# Patient Record
Sex: Female | Born: 1995 | Race: Black or African American | Hispanic: No | Marital: Single | Smoking: Current some day smoker
Health system: Southern US, Community
[De-identification: ages and names within clinical notes are randomized; demographics above are authoritative.]

---
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
## 2019-08-16 ENCOUNTER — Other Ambulatory Visit: Payer: Self-pay

## 2019-08-16 ENCOUNTER — Encounter (HOSPITAL_COMMUNITY): Payer: Self-pay

## 2019-08-16 ENCOUNTER — Emergency Department (HOSPITAL_COMMUNITY)
Admission: EM | Admit: 2019-08-16 | Discharge: 2019-08-16 | Disposition: A | Discharge status: Home / Self Care | Payer: Managed Care, Other (non HMO) | Attending: Emergency Medicine | Admitting: Emergency Medicine

## 2019-08-16 DIAGNOSIS — Z79899 Other long term (current) drug therapy: Secondary | ICD-10-CM | POA: Diagnosis not present

## 2019-08-16 DIAGNOSIS — R112 Nausea with vomiting, unspecified: Secondary | ICD-10-CM | POA: Diagnosis present

## 2019-08-16 LAB — HCG, QUANTITATIVE, PREGNANCY: hCG, Beta Chain, Quant, S: <1 m[IU]/mL (ref <5)

## 2019-08-16 LAB — URINALYSIS, ROUTINE W REFLEX MICROSCOPIC
Bacteria, UA: NONE SEEN
Bilirubin Urine: NEGATIVE
Glucose, UA: NEGATIVE mg/dL
Hgb urine dipstick: NEGATIVE
Ketones, ur: NEGATIVE mg/dL
Nitrite: NEGATIVE
Protein, ur: NEGATIVE mg/dL
Specific Gravity, Urine: 1.026 (ref 1.005–1.030)
pH: 7 (ref 5.0–8.0)

## 2019-08-16 LAB — COMPREHENSIVE METABOLIC PANEL
ALT: 33 U/L (ref 0–44)
AST: 25 U/L (ref 15–41)
Albumin: 4.3 g/dL (ref 3.5–5.0)
Alkaline Phosphatase: 69 U/L (ref 38–126)
Anion gap: 10 (ref 5–15)
BUN: 14 mg/dL (ref 6–20)
CO2: 22 mmol/L (ref 22–32)
Calcium: 9.6 mg/dL (ref 8.9–10.3)
Chloride: 108 mmol/L (ref 98–111)
Creatinine, Ser: 0.68 mg/dL (ref 0.44–1.00)
GFR calc Af Amer: >60 mL/min (ref >60)
GFR calc non Af Amer: >60 mL/min (ref >60)
Glucose, Bld: 106 mg/dL — ABNORMAL HIGH (ref 70–99)
Potassium: 3.9 mmol/L (ref 3.5–5.1)
Sodium: 140 mmol/L (ref 135–145)
Total Bilirubin: 1.1 mg/dL (ref 0.3–1.2)
Total Protein: 8.4 g/dL — ABNORMAL HIGH (ref 6.5–8.1)

## 2019-08-16 LAB — CBC
HCT: 41 % (ref 36.0–46.0)
Hemoglobin: 13 g/dL (ref 12.0–15.0)
MCH: 27.4 pg (ref 26.0–34.0)
MCHC: 31.7 g/dL (ref 30.0–36.0)
MCV: 86.5 fL (ref 80.0–100.0)
Platelets: 228 10*3/uL (ref 150–400)
RBC: 4.74 MIL/uL (ref 3.87–5.11)
RDW: 13.6 % (ref 11.5–15.5)
WBC: 8 10*3/uL (ref 4.0–10.5)
nRBC: 0 % (ref 0.0–0.2)

## 2019-08-16 LAB — LIPASE, BLOOD: Lipase: 24 U/L (ref 11–51)

## 2019-08-16 MED ORDER — ONDANSETRON HCL 4 MG/2ML IJ SOLN
4.0000 mg | Freq: Once | INTRAMUSCULAR | Status: AC
  Administered 2019-08-16: 4 mg via INTRAVENOUS
  Filled 2019-08-16: qty 2

## 2019-08-16 MED ORDER — ONDANSETRON 8 MG PO TBDP: 8.0000 mg | ORAL_TABLET | Freq: Three times a day (TID) | ORAL | 0 refills | Status: DC | PRN

## 2019-08-16 MED ORDER — SODIUM CHLORIDE 0.9 % IV BOLUS (SEPSIS)
1000.0000 mL | Freq: Once | INTRAVENOUS | Status: AC
  Administered 2019-08-16: 1000 mL via INTRAVENOUS

## 2019-08-16 MED ORDER — SODIUM CHLORIDE 0.9% FLUSH: 3.0000 mL | Freq: Once | INTRAVENOUS | Status: DC

## 2019-08-16 MED ORDER — SODIUM CHLORIDE 0.9 % IV SOLN: 1000.0000 mL | INTRAVENOUS | Status: DC

## 2019-08-16 NOTE — ED Provider Notes (Signed)
[facility] DEPT Provider Note   CSN: [id number] Arrival date & time: 08/16/19  1030     History   Chief Complaint Chief Complaint  Patient presents with  . Emesis    HPI Cassandra Spencer is a 23 y.o. female.     HPI Patient presents to the emergency room with complaints of nausea and vomiting.  Patient admits to excessive alcohol consumption last evening.  Since she woke up this morning she has had persistent nausea and vomiting.  Patient is unable to keep down any food or fluids.  She last vomited 30 minutes ago.  She denies any diarrhea.  No abdominal pain.  No fevers.  Patient denies having any previous episodes similar to this. History reviewed. No pertinent past medical history.  There are no active problems to display for this patient.   History reviewed. No pertinent surgical history.   OB History   No obstetric history on file.      Home Medications    Prior to Admission medications   Medication Sig Start Date End Date Taking? Authorizing Provider  hydrOXYzine (ATARAX/VISTARIL) 25 MG tablet Take 1 tablet (25 mg total) by mouth every 6 (six) hours. Prn hives 12/10/16   Billy Fischer, MD  ipratropium (ATROVENT) 0.06 % nasal spray Place 2 sprays into both nostrils 4 (four) times daily. 11/30/15   Billy Fischer, MD  ondansetron (ZOFRAN ODT) 8 MG disintegrating tablet Take 1 tablet (8 mg total) by mouth every 8 (eight) hours as needed for nausea or vomiting. 08/16/19   Dorie Rank, MD    Family History History reviewed. No pertinent family history.  Social History Social History   Tobacco Use  . Smoking status: Never Smoker  . Smokeless tobacco: Never Used  Substance Use Topics  . Alcohol use: No  . Drug use: No     Allergies   Patient has no known allergies.   Review of Systems Review of Systems  All other systems reviewed and are negative.    Physical Exam Updated Vital Signs BP 125/76 (BP Location: Left Arm)    Pulse 90   Temp 98.3 F (36.8 C) (Oral)   Resp 17   SpO2 100%   Physical Exam Vitals signs and nursing note reviewed.  Constitutional:      General: She is not in acute distress.    Appearance: She is well-developed.  HENT:     Head: Normocephalic and atraumatic.     Right Ear: External ear normal.     Left Ear: External ear normal.  Eyes:     General: No scleral icterus.       Right eye: No discharge.        Left eye: No discharge.     Conjunctiva/sclera: Conjunctivae normal.  Neck:     Musculoskeletal: Neck supple.     Trachea: No tracheal deviation.  Cardiovascular:     Rate and Rhythm: Normal rate and regular rhythm.  Pulmonary:     Effort: Pulmonary effort is normal. No respiratory distress.     Breath sounds: Normal breath sounds. No stridor. No wheezing or rales.  Abdominal:     General: Bowel sounds are normal. There is no distension.     Palpations: Abdomen is soft.     Tenderness: There is no abdominal tenderness. There is no guarding or rebound.  Musculoskeletal:        General: No tenderness.  Skin:    General: Skin is warm and dry.  Findings: No rash.  Neurological:     Mental Status: She is alert.     Cranial Nerves: No cranial nerve deficit (no facial droop, extraocular movements intact, no slurred speech).     Sensory: No sensory deficit.     Motor: No abnormal muscle tone or seizure activity.     Coordination: Coordination normal.      ED Treatments / Results  Labs (all labs ordered are listed, but only abnormal results are displayed) Labs Reviewed  COMPREHENSIVE METABOLIC PANEL - Abnormal; Notable for the following components:      Result Value   Glucose, Bld 106 (*)    Total Protein 8.4 (*)    All other components within normal limits  URINALYSIS, ROUTINE W REFLEX MICROSCOPIC - Abnormal; Notable for the following components:   Leukocytes,Ua TRACE (*)    All other components within normal limits  LIPASE, BLOOD  CBC  HCG, QUANTITATIVE,  PREGNANCY    EKG None  Radiology No results found.  Procedures Procedures (including critical care time)  Medications Ordered in ED Medications  sodium chloride flush (NS) 0.9 % injection 3 mL (has no administration in time range)  sodium chloride 0.9 % bolus 1,000 mL (1,000 mLs Intravenous New Bag/Given 08/16/19 1523)    Followed by  sodium chloride 0.9 % bolus 1,000 mL (1,000 mLs Intravenous New Bag/Given 08/16/19 1535)    Followed by  0.9 %  sodium chloride infusion (has no administration in time range)  ondansetron (ZOFRAN) injection 4 mg (4 mg Intravenous Given 08/16/19 1524)     Initial Impression / Assessment and Plan / ED Course  I have reviewed the triage vital signs and the nursing notes.  Pertinent labs & imaging results that were available during my care of the patient were reviewed by me and considered in my medical decision making (see chart for details).  Clinical Course as of Aug 16 1639  Sun Aug 16, 2019  1506 Labs without signs of hepatitis or pancreatitis.  Most likely alcoholic gastritis.  We will proceed with IV hydration and antiemetics.   [JK]  1639 Patient is feeling better after treatment.   [JK]    Clinical Course User Index [JK] Linwood Dibbles, MD     Symptoms consistent with alcohol induced gastritis.  No signs of hepatitis or pancreatitis.  Patient improved with IV hydration and antiemetics.  Final Clinical Impressions(s) / ED Diagnoses   Final diagnoses:  Non-intractable vomiting with nausea, unspecified vomiting type    ED Discharge Orders         Ordered    ondansetron (ZOFRAN ODT) 8 MG disintegrating tablet  Every 8 hours PRN,   Status:  Discontinued     08/16/19 1637    ondansetron (ZOFRAN ODT) 8 MG disintegrating tablet  Every 8 hours PRN     08/16/19 1638           Linwood Dibbles, MD 08/16/19 1640

## 2019-08-16 NOTE — ED Notes (Signed)
Pt states nausea has subsided and she "is feeling a lot better now".
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
## 2019-08-16 NOTE — ED Triage Notes (Signed)
Pt states she drank ETOH last night and has had 5 episodes of emesis today. Unable to keep food/water down.

## 2019-08-16 NOTE — Discharge Instructions (Addendum)
Take the medications as needed for nausea and vomiting.  I sent the prescription to your pharmacy.  I also gave you a paper prescription in case your pharmacy is closed.  You do not need to fill both, they are the same prescription.

## 2019-11-02 ENCOUNTER — Ambulatory Visit (HOSPITAL_COMMUNITY)
Admission: EM | Admit: 2019-11-02 | Discharge: 2019-11-02 | Disposition: A | Discharge status: Home / Self Care | Payer: Self-pay | Attending: Family Medicine | Admitting: Family Medicine

## 2019-11-02 ENCOUNTER — Ambulatory Visit (INDEPENDENT_AMBULATORY_CARE_PROVIDER_SITE_OTHER): Payer: Self-pay

## 2019-11-02 ENCOUNTER — Other Ambulatory Visit: Payer: Self-pay

## 2019-11-02 ENCOUNTER — Encounter (HOSPITAL_COMMUNITY): Payer: Self-pay

## 2019-11-02 DIAGNOSIS — S93402A Sprain of unspecified ligament of left ankle, initial encounter: Secondary | ICD-10-CM

## 2019-11-02 MED ORDER — IBUPROFEN 800 MG PO TABS: 800.0000 mg | ORAL_TABLET | Freq: Three times a day (TID) | ORAL | 0 refills | Status: DC | PRN
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
## 2021-10-29 ENCOUNTER — Encounter (HOSPITAL_BASED_OUTPATIENT_CLINIC_OR_DEPARTMENT_OTHER): Payer: Self-pay | Admitting: Obstetrics and Gynecology

## 2021-10-29 ENCOUNTER — Emergency Department (HOSPITAL_BASED_OUTPATIENT_CLINIC_OR_DEPARTMENT_OTHER)
Admission: EM | Admit: 2021-10-29 | Discharge: 2021-10-29 | Disposition: A | Discharge status: Home / Self Care | Payer: Managed Care, Other (non HMO) | Attending: Emergency Medicine | Admitting: Emergency Medicine

## 2021-10-29 ENCOUNTER — Other Ambulatory Visit: Payer: Self-pay

## 2021-10-29 DIAGNOSIS — F1010 Alcohol abuse, uncomplicated: Secondary | ICD-10-CM

## 2021-10-29 DIAGNOSIS — F1090 Alcohol use, unspecified, uncomplicated: Secondary | ICD-10-CM | POA: Insufficient documentation

## 2021-10-29 DIAGNOSIS — F5081 Binge eating disorder: Secondary | ICD-10-CM | POA: Insufficient documentation

## 2021-10-29 DIAGNOSIS — N390 Urinary tract infection, site not specified: Secondary | ICD-10-CM | POA: Insufficient documentation

## 2021-10-29 LAB — URINALYSIS, ROUTINE W REFLEX MICROSCOPIC
Bilirubin Urine: NEGATIVE
Glucose, UA: NEGATIVE mg/dL
Ketones, ur: NEGATIVE mg/dL
Nitrite: NEGATIVE
Protein, ur: 30 mg/dL — AB
Specific Gravity, Urine: 1.032 — ABNORMAL HIGH (ref 1.005–1.030)
pH: 6.5 (ref 5.0–8.0)

## 2021-10-29 LAB — COMPREHENSIVE METABOLIC PANEL
ALT: 16 U/L (ref 0–44)
AST: 16 U/L (ref 15–41)
Albumin: 4.4 g/dL (ref 3.5–5.0)
Alkaline Phosphatase: 58 U/L (ref 38–126)
Anion gap: 9 (ref 5–15)
BUN: 14 mg/dL (ref 6–20)
CO2: 24 mmol/L (ref 22–32)
Calcium: 9.5 mg/dL (ref 8.9–10.3)
Chloride: 106 mmol/L (ref 98–111)
Creatinine, Ser: 0.74 mg/dL (ref 0.44–1.00)
GFR, Estimated: >60 mL/min (ref >60)
Glucose, Bld: 114 mg/dL — ABNORMAL HIGH (ref 70–99)
Potassium: 4 mmol/L (ref 3.5–5.1)
Sodium: 139 mmol/L (ref 135–145)
Total Bilirubin: 0.9 mg/dL (ref 0.3–1.2)
Total Protein: 8.1 g/dL (ref 6.5–8.1)

## 2021-10-29 LAB — LIPASE, BLOOD: Lipase: <10 U/L — ABNORMAL LOW (ref 11–51)

## 2021-10-29 LAB — CBC
HCT: 40.7 % (ref 36.0–46.0)
Hemoglobin: 13.2 g/dL (ref 12.0–15.0)
MCH: 28 pg (ref 26.0–34.0)
MCHC: 32.4 g/dL (ref 30.0–36.0)
MCV: 86.2 fL (ref 80.0–100.0)
Platelets: 237 10*3/uL (ref 150–400)
RBC: 4.72 MIL/uL (ref 3.87–5.11)
RDW: 13.3 % (ref 11.5–15.5)
WBC: 6.6 10*3/uL (ref 4.0–10.5)
nRBC: 0 % (ref 0.0–0.2)

## 2021-10-29 LAB — PREGNANCY, URINE: Preg Test, Ur: NEGATIVE

## 2021-10-29 MED ORDER — ONDANSETRON HCL 4 MG/2ML IJ SOLN
4.0000 mg | Freq: Once | INTRAMUSCULAR | Status: AC
  Administered 2021-10-29: 4 mg via INTRAVENOUS
  Filled 2021-10-29: qty 2

## 2021-10-29 MED ORDER — CEPHALEXIN 500 MG PO CAPS: 500.0000 mg | ORAL_CAPSULE | Freq: Four times a day (QID) | ORAL | 0 refills | Status: AC

## 2021-10-29 MED ORDER — SODIUM CHLORIDE 0.9 % IV BOLUS
1000.0000 mL | Freq: Once | INTRAVENOUS | Status: AC
  Administered 2021-10-29: 1000 mL via INTRAVENOUS

## 2021-10-29 MED ORDER — ONDANSETRON HCL 4 MG PO TABS: 4.0000 mg | ORAL_TABLET | Freq: Three times a day (TID) | ORAL | 0 refills | Status: DC | PRN

## 2021-10-29 NOTE — ED Provider Notes (Signed)
[facility] EMERGENCY DEPT Provider Note   CSN: [id number] Arrival date & time: 10/29/21  1221     History  Chief Complaint  Patient presents with   Emesis    Cassandra Spencer is a 26 y.o. female.  Presents ER with concern for vomiting.  Patient reports that she drank heavily last night, mostly liquor.  Believes that she drank too much.  When she woke up she felt very nauseous, vomiting, multiple episodes of vomiting, no blood.  No abdominal pain.  No fevers or chills.  No dysuria or hematuria for flank pain.  Reviewed chart, no recent PCP visits, last ER visit in 2021 for ankle sprain.  HPI     Home Medications Prior to Admission medications   Medication Sig Start Date End Date Taking? Authorizing Provider  cephALEXin (KEFLEX) 500 MG capsule Take 1 capsule (500 mg total) by mouth 4 (four) times daily for 5 days. 10/29/21 11/03/21 Yes Xylan Sheils, Ellwood Dense, MD  ondansetron (ZOFRAN) 4 MG tablet Take 1 tablet (4 mg total) by mouth every 8 (eight) hours as needed for nausea or vomiting. 10/29/21  Yes Lucrezia Starch, MD  hydrOXYzine (ATARAX/VISTARIL) 25 MG tablet Take 1 tablet (25 mg total) by mouth every 6 (six) hours. Prn hives 12/10/16   Billy Fischer, MD  ibuprofen (ADVIL) 800 MG tablet Take 1 tablet (800 mg total) by mouth every 8 (eight) hours as needed (PAIN). 11/02/19   Enrique Sack, FNP  ipratropium (ATROVENT) 0.06 % nasal spray Place 2 sprays into both nostrils 4 (four) times daily. 11/30/15   Billy Fischer, MD  ondansetron (ZOFRAN ODT) 8 MG disintegrating tablet Take 1 tablet (8 mg total) by mouth every 8 (eight) hours as needed for nausea or vomiting. 08/16/19   Dorie Rank, MD      Allergies    Patient has no known allergies.    Review of Systems   Review of Systems  Constitutional:  Negative for chills and fever.  HENT:  Negative for ear pain and sore throat.   Eyes:  Negative for pain and visual disturbance.  Respiratory:  Negative for cough and  shortness of breath.   Cardiovascular:  Negative for chest pain and palpitations.  Gastrointestinal:  Positive for nausea and vomiting. Negative for abdominal pain.  Genitourinary:  Negative for dysuria and hematuria.  Musculoskeletal:  Negative for arthralgias and back pain.  Skin:  Negative for color change and rash.  Neurological:  Negative for seizures and syncope.  All other systems reviewed and are negative.  Physical Exam Updated Vital Signs BP 111/78    Pulse 98    Temp 98.4 F (36.9 C)    Resp 18    Ht 5\' 7"  (1.702 m)    Wt 104.3 kg    LMP 10/22/2021 (Approximate)    SpO2 100%    BMI 36.02 kg/m  Physical Exam Vitals and nursing note reviewed.  Constitutional:      General: She is not in acute distress.    Appearance: She is well-developed.  HENT:     Head: Normocephalic and atraumatic.  Eyes:     Conjunctiva/sclera: Conjunctivae normal.  Cardiovascular:     Rate and Rhythm: Normal rate and regular rhythm.     Heart sounds: No murmur heard. Pulmonary:     Effort: Pulmonary effort is normal. No respiratory distress.     Breath sounds: Normal breath sounds.  Abdominal:     Palpations: Abdomen is soft.     Tenderness:  There is no abdominal tenderness.  Musculoskeletal:        General: No swelling.     Cervical back: Neck supple.  Skin:    General: Skin is warm and dry.     Capillary Refill: Capillary refill takes less than 2 seconds.  Neurological:     General: No focal deficit present.     Mental Status: She is alert.  Psychiatric:        Mood and Affect: Mood normal.    ED Results / Procedures / Treatments   Labs (all labs ordered are listed, but only abnormal results are displayed) Labs Reviewed  LIPASE, BLOOD - Abnormal; Notable for the following components:      Result Value   Lipase <10 (*)    All other components within normal limits  COMPREHENSIVE METABOLIC PANEL - Abnormal; Notable for the following components:   Glucose, Bld 114 (*)    All other  components within normal limits  URINALYSIS, ROUTINE W REFLEX MICROSCOPIC - Abnormal; Notable for the following components:   APPearance HAZY (*)    Specific Gravity, Urine 1.032 (*)    Hgb urine dipstick SMALL (*)    Protein, ur 30 (*)    Leukocytes,Ua MODERATE (*)    Bacteria, UA MANY (*)    All other components within normal limits  CBC  PREGNANCY, URINE    EKG None  Radiology No results found.  Procedures Procedures    Medications Ordered in ED Medications  sodium chloride 0.9 % bolus 1,000 mL (0 mLs Intravenous Stopped 10/29/21 1519)  ondansetron (ZOFRAN) injection 4 mg (4 mg Intravenous Given 10/29/21 1337)    ED Course/ Medical Decision Making/ A&P                           Medical Decision Making  -year-old lady presents to ER with concern for nausea and vomiting as well as large alcohol consumption last night.  She appears well in no distress, vital signs are within normal limits.  Abdomen is soft.  Basic lab work was obtained and grossly within normal limits.  No electrolyte derangements.  Normal kidney function.  Urinalysis was obtained and demonstrated many bacteria, moderate leukocytes concerning for possible UTI.  Given her nausea and vomiting, will treat for possible UTI.  Patient had marked improvement after receiving fluids and antiemetics.  No abdominal pain reported and no abdominal tenderness on repeat exam.  Therefore doubt acute abdominal process such as appendicitis or acute biliary process.  Do not feel any CT or ultrasound indicated at this time.  Given well appearance and tolerating p.o. without difficulty and symptoms well controlled, feel she can be managed in the outpatient setting and has not required admission.  Will give Rx for antiemetic and antibiotics at home.  Reviewed return precautions and discharged.          Final Clinical Impression(s) / ED Diagnoses Final diagnoses:  Urinary tract infection without hematuria, site unspecified   Alcohol consumption binge drinking    Rx / DC Orders ED Discharge Orders          Ordered    cephALEXin (KEFLEX) 500 MG capsule  4 times daily        10/29/21 1545    ondansetron (ZOFRAN) 4 MG tablet  Every 8 hours PRN        10/29/21 1545              Fynley Chrystal, Ellwood Dense,  MD 10/30/21 1535

## 2021-10-29 NOTE — Discharge Instructions (Addendum)
Recommend avoiding any binge drinking.  Take antibiotic as prescribed for possible UTI.  Take Zofran as needed for nausea.  If you develop worsening nausea, vomiting, fever, abdominal pain or other new concerning symptom, come back to ER for reassessment.  Follow-up with your primary care doctor.

## 2021-10-29 NOTE — ED Triage Notes (Signed)
Patient reports she thinks she drank too much last night and has been vomiting for almost 4 hours. Patient reports LMP last week.

## 2022-05-17 ENCOUNTER — Emergency Department (HOSPITAL_BASED_OUTPATIENT_CLINIC_OR_DEPARTMENT_OTHER)
Admission: EM | Admit: 2022-05-17 | Discharge: 2022-05-17 | Disposition: A | Discharge status: Home / Self Care | Payer: BC Managed Care – PPO | Attending: Emergency Medicine | Admitting: Emergency Medicine

## 2022-05-17 ENCOUNTER — Encounter (HOSPITAL_BASED_OUTPATIENT_CLINIC_OR_DEPARTMENT_OTHER): Payer: Self-pay

## 2022-05-17 ENCOUNTER — Other Ambulatory Visit: Payer: Self-pay

## 2022-05-17 DIAGNOSIS — R1112 Projectile vomiting: Secondary | ICD-10-CM

## 2022-05-17 DIAGNOSIS — R112 Nausea with vomiting, unspecified: Secondary | ICD-10-CM | POA: Diagnosis present

## 2022-05-17 LAB — CBC WITH DIFFERENTIAL/PLATELET
Abs Immature Granulocytes: 0.02 10*3/uL (ref 0.00–0.07)
Basophils Absolute: 0.1 10*3/uL (ref 0.0–0.1)
Basophils Relative: 1 %
Eosinophils Absolute: 0.1 10*3/uL (ref 0.0–0.5)
Eosinophils Relative: 2 %
HCT: 38.4 % (ref 36.0–46.0)
Hemoglobin: 12.7 g/dL (ref 12.0–15.0)
Immature Granulocytes: 0 %
Lymphocytes Relative: 16 %
Lymphs Abs: 1.1 10*3/uL (ref 0.7–4.0)
MCH: 28.2 pg (ref 26.0–34.0)
MCHC: 33.1 g/dL (ref 30.0–36.0)
MCV: 85.3 fL (ref 80.0–100.0)
Monocytes Absolute: 0.4 10*3/uL (ref 0.1–1.0)
Monocytes Relative: 5 %
Neutro Abs: 5.1 10*3/uL (ref 1.7–7.7)
Neutrophils Relative %: 76 %
Platelets: 221 10*3/uL (ref 150–400)
RBC: 4.5 MIL/uL (ref 3.87–5.11)
RDW: 13.5 % (ref 11.5–15.5)
WBC: 6.7 10*3/uL (ref 4.0–10.5)
nRBC: 0 % (ref 0.0–0.2)

## 2022-05-17 LAB — BASIC METABOLIC PANEL
Anion gap: 10 (ref 5–15)
BUN: 12 mg/dL (ref 6–20)
CO2: 25 mmol/L (ref 22–32)
Calcium: 9.6 mg/dL (ref 8.9–10.3)
Chloride: 104 mmol/L (ref 98–111)
Creatinine, Ser: 0.73 mg/dL (ref 0.44–1.00)
GFR, Estimated: >60 mL/min (ref >60)
Glucose, Bld: 97 mg/dL (ref 70–99)
Potassium: 4.2 mmol/L (ref 3.5–5.1)
Sodium: 139 mmol/L (ref 135–145)

## 2022-05-17 MED ORDER — SODIUM CHLORIDE 0.9 % IV BOLUS
1000.0000 mL | Freq: Once | INTRAVENOUS | Status: AC
  Administered 2022-05-17: 1000 mL via INTRAVENOUS

## 2022-05-17 MED ORDER — ONDANSETRON HCL 4 MG/2ML IJ SOLN
4.0000 mg | Freq: Once | INTRAMUSCULAR | Status: AC
  Administered 2022-05-17: 4 mg via INTRAVENOUS
  Filled 2022-05-17: qty 2

## 2022-05-17 NOTE — ED Notes (Signed)
Discharge instructions and follow up care reviewed and explained. Pt verbalized understanding and had no further questions. Pt caox4 and ambulatory on d/c.

## 2022-05-17 NOTE — Discharge Instructions (Addendum)
You were seen and treated today for evaluation of nausea and vomiting secondary to alcohol intoxication.  We treated you today with IV fluids and Zofran for nausea.  You tolerated these well and stated that your nausea was down to a 1 out of 10.  He also ate food and drink some liquids while you were able to keep this down.  At this time there does not appear to be the presence of an emergent medical condition, however there is always the potential for conditions to change. Please read and follow the below instructions.  Please return to the Emergency Department immediately for any new or worsening symptoms or if your symptoms do not improve within 7 days. Please be sure to follow up with your Primary Care Provider within one week regarding your visit today; please call their office to schedule an appointment even if you are feeling better for a follow-up visit. You should continue to hydrate at home.  Drink small sips of water frequently to avoid upset stomach.  You should also try to eat in small bites to avoid an upset stomach.   Please read the additional information packets attached to your discharge summary.  Get help right away if: You have pain in your chest, neck, arm, or jaw. You feel very weak or you faint. You vomit again and again. You have vomit that is bright red or looks like black coffee grounds. You have bloody or black poop (stools) or poop that looks like tar. You have a very bad headache, a stiff neck, or both. You have very bad pain, cramping, or bloating in your belly (abdomen). You have trouble breathing. You are breathing very quickly. Your heart is beating very quickly. Your skin feels cold and clammy. You feel confused. You have signs of losing too much water in your body, such as: Dark pee, very little pee, or no pee. Cracked lips. Dry mouth. Sunken eyes. Sleepiness. Weakness. These symptoms may be an emergency. Get help right away. Call 911. Do not wait to  see if the symptoms will go away. Do not drive yourself to the hospital.  Do not take your medicine if  develop an itchy rash, swelling in your mouth or lips, or difficulty breathing; call 911 and seek immediate emergency medical attention if this occurs.  You may review your lab tests and imaging results in their entirety on your MyChart account.  Please discuss all results of fully with your primary care provider and other specialist at your follow-up visit.  Note: Portions of this text may have been transcribed using voice recognition software. Every effort was made to ensure accuracy; however, inadvertent computerized transcription errors may still be present.

## 2022-05-17 NOTE — ED Triage Notes (Signed)
She tells me that "I drank too much last night, and today I can't stop throwing up". She is ambulatory and in no distress.

## 2022-05-17 NOTE — ED Notes (Signed)
Water and saltine crackers provided at the bedside for PO challenge.

## 2022-05-17 NOTE — ED Provider Notes (Signed)
[facility] EMERGENCY DEPT Provider Note   CSN: [id number] Arrival date & time: 05/17/22  1249     History  Chief Complaint  Patient presents with   Vomiting    Cassandra Spencer is a 26 y.o. female.  Patient is a 26 year old female with a past medical history of anxiety presenting to the ED for evaluation of nausea and vomiting secondary to alcohol intake.  Patient states that she had 2 shots and 2 drinks last night which is significantly more than she usually has.  She woke up this morning with nausea and vomiting and has not been able to make it stop on her own.  She states she has not tried to eat anything this morning but has tried to drink liquids but vomits them back up.  Denies possibility of being drugged.  She denies abdominal pain, urinary symptoms, chest pain, shortness of breath, fevers, chills, night sweats, weakness, fatigue.        Home Medications Prior to Admission medications   Medication Sig Start Date End Date Taking? Authorizing Provider  hydrOXYzine (ATARAX/VISTARIL) 25 MG tablet Take 1 tablet (25 mg total) by mouth every 6 (six) hours. Prn hives 12/10/16   Linna Hoff, MD  ibuprofen (ADVIL) 800 MG tablet Take 1 tablet (800 mg total) by mouth every 8 (eight) hours as needed (PAIN). 11/02/19   Lurline Idol, FNP  ipratropium (ATROVENT) 0.06 % nasal spray Place 2 sprays into both nostrils 4 (four) times daily. 11/30/15   Linna Hoff, MD  ondansetron (ZOFRAN ODT) 8 MG disintegrating tablet Take 1 tablet (8 mg total) by mouth every 8 (eight) hours as needed for nausea or vomiting. 08/16/19   Linwood Dibbles, MD  ondansetron (ZOFRAN) 4 MG tablet Take 1 tablet (4 mg total) by mouth every 8 (eight) hours as needed for nausea or vomiting. 10/29/21   Milagros Loll, MD      Allergies    Patient has no known allergies.    Review of Systems   Review of Systems  Physical Exam Updated Vital Signs BP 123/76   Pulse 91   Temp 98.3 F (36.8 C) (Oral)    Resp 20   Ht 5\' 7"  (1.702 m)   Wt 108.9 kg   LMP 04/09/2022 (Exact Date)   SpO2 100%   BMI 37.59 kg/m  Physical Exam Vitals and nursing note reviewed.  Constitutional:      General: She is not in acute distress.    Appearance: Normal appearance. She is well-developed. She is obese. She is not ill-appearing, toxic-appearing or diaphoretic.  HENT:     Head: Normocephalic and atraumatic.  Eyes:     Extraocular Movements: Extraocular movements intact.     Conjunctiva/sclera: Conjunctivae normal.  Cardiovascular:     Rate and Rhythm: Normal rate and regular rhythm.     Heart sounds: No murmur heard. Pulmonary:     Effort: Pulmonary effort is normal. No respiratory distress.     Breath sounds: Normal breath sounds.  Abdominal:     General: Bowel sounds are normal. There is no distension.     Palpations: Abdomen is soft.     Tenderness: There is no abdominal tenderness. There is no guarding.  Musculoskeletal:        General: No swelling.     Cervical back: Neck supple.  Skin:    General: Skin is warm and dry.  Neurological:     Mental Status: She is alert and oriented to person, place, and  time.  Psychiatric:        Mood and Affect: Mood normal.     ED Results / Procedures / Treatments   Labs (all labs ordered are listed, but only abnormal results are displayed) Labs Reviewed  CBC WITH DIFFERENTIAL/PLATELET  BASIC METABOLIC PANEL    EKG None  Radiology No results found.  Procedures Procedures    Medications Ordered in ED Medications  sodium chloride 0.9 % bolus 1,000 mL (1,000 mLs Intravenous New Bag/Given 05/17/22 1406)  ondansetron (ZOFRAN) injection 4 mg (4 mg Intravenous Given 05/17/22 1407)    ED Course/ Medical Decision Making/ A&P Clinical Course as of 05/17/22 1558  Thu May 17, 2022  1258 Hemodynamically stable [AS]  1451 BMP and CBC all within normal limits, significant dehydration unlikely. [AS]  1509 Patient reports feeling better, her nausea is  now out of 1 out of 10.  She has not had any episodes of emesis since receiving Zofran.  We will give her a p.o. trial.  If she can pass that she will be discharged home. [AS]  1531 Patient ate crackers and drink some juice approximately 20 minutes ago.  Patient has been able to get up food down.  No nausea at this time. [AS]    Clinical Course User Index [AS] Michelle Piper, PA-C                           Medical Decision Making Patient is a 26 year old female with no significant past medical history and comorbidities of smoking and obesity presents to the ED for evaluation of nausea and vomiting.  Patient states that she had 2 shots and 2 drinks last night which is significantly more than she usually has.  She reports waking up this morning vomiting.  Has not been able to make it stop.  She had not tried to eat anything, but tried drinking.  She was unable to keep any liquids down, she vomited them back up immediately. The emergent differential diagnosis for vomiting includes, but is not limited to ACS/MI, DKA, Ischemic bowel, Meningitis, Sepsis, Acute gastric dilation, Adrenal insufficiency, Appendicitis,  Bowel obstruction/ileus, Carbon monoxide poisoning, Cholecystitis, Electrolyte abnormalities, Elevated ICP, Gastric outlet obstruction, Pancreatitis, Ruptured viscus, Biliary colic, Cannabinoid hyperemesis syndrome, Gastritis, Gastroenteritis, Gastroparesis,  Narcotic withdrawal, Peptic ulcer disease, and UTI.  With her minimal past medical history and lack of abdominal pain or any other symptoms other than nausea and emesis, I will begin with basic labs and a liter of fluid along with Zofran.  I will reassess her once her fluids are done and give her a p.o. trial.  I ordered, reviewed and interpreted labs which include: CBC and CMP.  Results were within normal limits.  No infectious or hepatic or renal issues.  After a liter of fluids and dose of Zofran, she reports her nausea is at a 1 out  of 10. she ate crackers and peanut butter, and drink some juice.  She has been able to keep that down for 45 minutes.  At this time I believe her main diagnoses are nausea and vomiting due to excessive alcohol consumption and she is appropriate for discharge.   At this time there does not appear to be any evidence of an acute emergency medical condition and the patient appears stable for discharge with appropriate outpatient follow up. Diagnosis was discussed with patient who verbalizes understanding of care plan and is agreeable to discharge. I have discussed return precautions  with patient who verbalizes understanding. Patient encouraged to follow-up with their PCP within 1 week. All questions answered.  Patient's case discussed with Dr. Rhunette Croft who agrees with plan to discharge with follow-up.   Note: Portions of this report may have been transcribed using voice recognition software. Every effort was made to ensure accuracy; however, inadvertent computerized transcription errors may still be present.   Amount and/or Complexity of Data Reviewed Labs: ordered.  Risk Prescription drug management.          Final Clinical Impression(s) / ED Diagnoses Final diagnoses:  None    Rx / DC Orders ED Discharge Orders     None         Michelle Piper, PA-C 05/17/22 1602    Derwood Kaplan, MD 05/18/22 [phone]

## 2023-01-25 ENCOUNTER — Other Ambulatory Visit: Payer: Self-pay

## 2023-01-25 ENCOUNTER — Emergency Department (HOSPITAL_BASED_OUTPATIENT_CLINIC_OR_DEPARTMENT_OTHER)
Admission: EM | Admit: 2023-01-25 | Discharge: 2023-01-25 | Disposition: A | Discharge status: Home / Self Care | Payer: BC Managed Care – PPO | Attending: Emergency Medicine | Admitting: Emergency Medicine

## 2023-01-25 ENCOUNTER — Encounter (HOSPITAL_BASED_OUTPATIENT_CLINIC_OR_DEPARTMENT_OTHER): Payer: Self-pay | Admitting: Emergency Medicine

## 2023-01-25 DIAGNOSIS — E860 Dehydration: Secondary | ICD-10-CM | POA: Insufficient documentation

## 2023-01-25 DIAGNOSIS — Z79899 Other long term (current) drug therapy: Secondary | ICD-10-CM | POA: Diagnosis not present

## 2023-01-25 DIAGNOSIS — R112 Nausea with vomiting, unspecified: Secondary | ICD-10-CM | POA: Insufficient documentation

## 2023-01-25 DIAGNOSIS — R1010 Upper abdominal pain, unspecified: Secondary | ICD-10-CM | POA: Insufficient documentation

## 2023-01-25 LAB — URINALYSIS, ROUTINE W REFLEX MICROSCOPIC
Bilirubin Urine: NEGATIVE
Glucose, UA: NEGATIVE mg/dL
Hgb urine dipstick: NEGATIVE
Ketones, ur: NEGATIVE mg/dL
Leukocytes,Ua: NEGATIVE
Nitrite: NEGATIVE
Protein, ur: 30 mg/dL — AB
Specific Gravity, Urine: 1.034 — ABNORMAL HIGH (ref 1.005–1.030)
pH: 7 (ref 5.0–8.0)

## 2023-01-25 LAB — COMPREHENSIVE METABOLIC PANEL
ALT: 26 U/L (ref 0–44)
AST: 23 U/L (ref 15–41)
Albumin: 4.5 g/dL (ref 3.5–5.0)
Alkaline Phosphatase: 64 U/L (ref 38–126)
Anion gap: 8 (ref 5–15)
BUN: 12 mg/dL (ref 6–20)
CO2: 25 mmol/L (ref 22–32)
Calcium: 9.7 mg/dL (ref 8.9–10.3)
Chloride: 106 mmol/L (ref 98–111)
Creatinine, Ser: 0.7 mg/dL (ref 0.44–1.00)
GFR, Estimated: >60 mL/min (ref >60)
Glucose, Bld: 112 mg/dL — ABNORMAL HIGH (ref 70–99)
Potassium: 4.3 mmol/L (ref 3.5–5.1)
Sodium: 139 mmol/L (ref 135–145)
Total Bilirubin: 0.7 mg/dL (ref 0.3–1.2)
Total Protein: 7.9 g/dL (ref 6.5–8.1)

## 2023-01-25 LAB — RAPID URINE DRUG SCREEN, HOSP PERFORMED
Amphetamines: NOT DETECTED
Barbiturates: NOT DETECTED
Benzodiazepines: NOT DETECTED
Cocaine: NOT DETECTED
Opiates: POSITIVE — AB
Tetrahydrocannabinol: POSITIVE — AB

## 2023-01-25 LAB — CBC
HCT: 38.6 % (ref 36.0–46.0)
Hemoglobin: 12.9 g/dL (ref 12.0–15.0)
MCH: 28.2 pg (ref 26.0–34.0)
MCHC: 33.4 g/dL (ref 30.0–36.0)
MCV: 84.3 fL (ref 80.0–100.0)
Platelets: 226 10*3/uL (ref 150–400)
RBC: 4.58 MIL/uL (ref 3.87–5.11)
RDW: 13.4 % (ref 11.5–15.5)
WBC: 7.9 10*3/uL (ref 4.0–10.5)
nRBC: 0 % (ref 0.0–0.2)

## 2023-01-25 LAB — LIPASE, BLOOD: Lipase: <10 U/L — ABNORMAL LOW (ref 11–51)

## 2023-01-25 LAB — PREGNANCY, URINE: Preg Test, Ur: NEGATIVE

## 2023-01-25 MED ORDER — METOCLOPRAMIDE HCL 5 MG/ML IJ SOLN
10.0000 mg | Freq: Once | INTRAMUSCULAR | Status: AC
  Administered 2023-01-25: 10 mg via INTRAVENOUS
  Filled 2023-01-25: qty 2

## 2023-01-25 MED ORDER — HYDROMORPHONE HCL 1 MG/ML IJ SOLN
0.5000 mg | Freq: Once | INTRAMUSCULAR | Status: AC
  Administered 2023-01-25: 0.5 mg via INTRAVENOUS
  Filled 2023-01-25: qty 1

## 2023-01-25 MED ORDER — ONDANSETRON HCL 4 MG/2ML IJ SOLN
4.0000 mg | Freq: Once | INTRAMUSCULAR | Status: AC
  Administered 2023-01-25: 4 mg via INTRAVENOUS
  Filled 2023-01-25: qty 2

## 2023-01-25 MED ORDER — LACTATED RINGERS IV BOLUS
1000.0000 mL | Freq: Once | INTRAVENOUS | Status: AC
  Administered 2023-01-25: 1000 mL via INTRAVENOUS

## 2023-01-25 MED ORDER — ONDANSETRON 8 MG PO TBDP: 8.0000 mg | ORAL_TABLET | Freq: Three times a day (TID) | ORAL | 0 refills | Status: DC | PRN

## 2023-01-25 NOTE — Discharge Instructions (Addendum)
It was our pleasure to provide your ER care today - we hope that you feel better.  Drink plenty of fluids/stay well hydrated.   Take acetaminophen or ibuprofen as need. Take zofran as need for nausea.   Note that increasingly we are seeing a recurrent abdominal pain and/or vomiting syndrome called Cannabinoid Hyperemesis Syndrome - see attached info - in these cases avoiding marijuana use will prevent symptoms from recurrent (note that symptoms can persist for a few weeks if history of heavy marijuana use as it can take time to get out of system).   Return to ER right away  if worse, fevers, new symptoms, new or worsening or severe abdominal pain, persistent vomiting, chest pain, trouble breathing, or other emergency concern.  You were given pain meds in the ER - no driving for the next 6 hours.

## 2023-01-25 NOTE — ED Notes (Signed)
Patient verbalizes understanding of discharge instructions. Opportunity for questioning and answers were provided. Patient discharged from ED.  °

## 2023-01-25 NOTE — ED Triage Notes (Signed)
Patient c/o vomiting since yesterday evening around 2000.  Patient denies pain.

## 2023-01-25 NOTE — ED Provider Notes (Signed)
[place] EMERGENCY DEPARTMENT AT [facility] Provider Note   CSN: [id number] Arrival date & time: 01/25/23  1540     History  Chief Complaint  Patient presents with   Vomiting    Cassandra Spencer is a 27 y.o. female.  Patient with c/o recurrent nausea/vomiting since yesterday, several times, emesis not bloody or bilious. Indicates had been drinking prior to onset symptoms. Mild upper abd pain/cramping, no distension. Having normal bms. No fever or chills. No known bad food ingestion or ill contacts. No chest pain or discomfort. No sob or unusual doe. Having regular periods. No vaginal discharge or bleeding. No dysuria or gu c/o.   The history is provided by the patient and medical records.       Home Medications Prior to Admission medications   Medication Sig Start Date End Date Taking? Authorizing Provider  hydrOXYzine (ATARAX/VISTARIL) 25 MG tablet Take 1 tablet (25 mg total) by mouth every 6 (six) hours. Prn hives 12/10/16   Linna Hoff, MD  ibuprofen (ADVIL) 800 MG tablet Take 1 tablet (800 mg total) by mouth every 8 (eight) hours as needed (PAIN). 11/02/19   Lurline Idol, FNP  ipratropium (ATROVENT) 0.06 % nasal spray Place 2 sprays into both nostrils 4 (four) times daily. 11/30/15   Linna Hoff, MD  ondansetron (ZOFRAN ODT) 8 MG disintegrating tablet Take 1 tablet (8 mg total) by mouth every 8 (eight) hours as needed for nausea or vomiting. 08/16/19   Linwood Dibbles, MD  ondansetron (ZOFRAN) 4 MG tablet Take 1 tablet (4 mg total) by mouth every 8 (eight) hours as needed for nausea or vomiting. 10/29/21   Milagros Loll, MD      Allergies    Patient has no known allergies.    Review of Systems   Review of Systems  Constitutional:  Negative for chills and fever.  Respiratory:  Negative for cough and shortness of breath.   Cardiovascular:  Negative for chest pain.  Gastrointestinal:  Positive for nausea and vomiting.  Genitourinary:  Negative for dysuria,  flank pain, vaginal bleeding and vaginal discharge.  Musculoskeletal:  Negative for back pain and neck pain.  Skin:  Negative for rash.  Neurological:  Negative for headaches.  Hematological:  Does not bruise/bleed easily.  Psychiatric/Behavioral:  Negative for confusion.     Physical Exam Updated Vital Signs BP 120/68   Pulse 91   Temp 98.5 F (36.9 C) (Oral)   Resp 20   Ht 1.702 m (5\' 7" )   Wt 113.4 kg   SpO2 99%   BMI 39.16 kg/m  Physical Exam Vitals and nursing note reviewed.  Constitutional:      Appearance: Normal appearance. She is well-developed.  HENT:     Head: Atraumatic.     Nose: Nose normal.     Mouth/Throat:     Mouth: Mucous membranes are moist.     Pharynx: Oropharynx is clear.  Eyes:     General: No scleral icterus.    Conjunctiva/sclera: Conjunctivae normal.  Neck:     Trachea: No tracheal deviation.  Cardiovascular:     Rate and Rhythm: Normal rate and regular rhythm.     Pulses: Normal pulses.     Heart sounds: Normal heart sounds. No murmur heard.    No friction rub. No gallop.  Pulmonary:     Effort: Pulmonary effort is normal. No respiratory distress.     Breath sounds: Normal breath sounds.  Abdominal:     General: Bowel  sounds are normal. There is no distension.     Palpations: Abdomen is soft. There is no mass.     Tenderness: There is no abdominal tenderness. There is no guarding.  Genitourinary:    Comments: No cva tenderness.  Musculoskeletal:        General: No swelling.     Cervical back: Normal range of motion and neck supple. No rigidity. No muscular tenderness.  Skin:    General: Skin is warm and dry.     Findings: No rash.  Neurological:     Mental Status: She is alert.     Comments: Alert, speech normal.   Psychiatric:        Mood and Affect: Mood normal.     ED Results / Procedures / Treatments   Labs (all labs ordered are listed, but only abnormal results are displayed) Results for orders placed or performed  during the hospital encounter of 01/25/23  CBC  Result Value Ref Range   WBC 7.9 4.0 - 10.5 K/uL   RBC 4.58 3.87 - 5.11 MIL/uL   Hemoglobin 12.9 12.0 - 15.0 g/dL   HCT 16.1 09.6 - 04.5 %   MCV 84.3 80.0 - 100.0 fL   MCH 28.2 26.0 - 34.0 pg   MCHC 33.4 30.0 - 36.0 g/dL   RDW 40.9 81.1 - 91.4 %   Platelets 226 150 - 400 K/uL   nRBC 0.0 0.0 - 0.2 %  Comprehensive metabolic panel  Result Value Ref Range   Sodium 139 135 - 145 mmol/L   Potassium 4.3 3.5 - 5.1 mmol/L   Chloride 106 98 - 111 mmol/L   CO2 25 22 - 32 mmol/L   Glucose, Bld 112 (H) 70 - 99 mg/dL   BUN 12 6 - 20 mg/dL   Creatinine, Ser 7.82 0.44 - 1.00 mg/dL   Calcium 9.7 8.9 - 95.6 mg/dL   Total Protein 7.9 6.5 - 8.1 g/dL   Albumin 4.5 3.5 - 5.0 g/dL   AST 23 15 - 41 U/L   ALT 26 0 - 44 U/L   Alkaline Phosphatase 64 38 - 126 U/L   Total Bilirubin 0.7 0.3 - 1.2 mg/dL   GFR, Estimated >21 >30 mL/min   Anion gap 8 5 - 15  Lipase, blood  Result Value Ref Range   Lipase <10 (L) 11 - 51 U/L      EKG None  Radiology No results found.  Procedures Procedures    Medications Ordered in ED Medications  lactated ringers bolus 1,000 mL (has no administration in time range)  lactated ringers bolus 1,000 mL (1,000 mLs Intravenous New Bag/Given 01/25/23 0736)  HYDROmorphone (DILAUDID) injection 0.5 mg (0.5 mg Intravenous Given 01/25/23 0737)  ondansetron (ZOFRAN) injection 4 mg (4 mg Intravenous Given 01/25/23 0737)    ED Course/ Medical Decision Making/ A&P                             Medical Decision Making Problems Addressed: Dehydration: acute illness or injury with systemic symptoms that poses a threat to life or bodily functions Nausea and vomiting in adult: acute illness or injury with systemic symptoms that poses a threat to life or bodily functions Upper abdominal pain: acute illness or injury  Amount and/or Complexity of Data Reviewed External Data Reviewed: notes. Labs: ordered. Decision-making details  documented in ED Course.  Risk Prescription drug management. Parenteral controlled substances. Decision regarding hospitalization.   Iv  ns. Continuous pulse ox and cardiac monitoring. Labs ordered/sent.   Differential diagnosis includes gastroenteritis, gastritis, dehydration, etc. Dispo decision including potential need for admission considered - will get labs and reassess.   Reviewed nursing notes and prior charts for additional history. External reports reviewed.   Cardiac monitor: sinus rhythm, rate 90.  LR bolus. Dilaudid iv, zofran iv.   Labs reviewed/interpreted by me - chem normal. Wbc and hgb normal. THC +.  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   Additional iv and po fluids.   Pt tolerating po, abd soft nt - pt appears stable for d/c.  Return precautions provided.              Final Clinical Impression(s) / ED Diagnoses Final diagnoses:  None    Rx / DC Orders ED Discharge Orders     None         Cathren Laine, MD 01/25/23 [phone]

## 2023-04-22 ENCOUNTER — Emergency Department (HOSPITAL_BASED_OUTPATIENT_CLINIC_OR_DEPARTMENT_OTHER)
Admission: EM | Admit: 2023-04-22 | Discharge: 2023-04-22 | Disposition: A | Discharge status: Home / Self Care | Payer: BC Managed Care – PPO | Attending: Emergency Medicine | Admitting: Emergency Medicine

## 2023-04-22 ENCOUNTER — Other Ambulatory Visit (HOSPITAL_BASED_OUTPATIENT_CLINIC_OR_DEPARTMENT_OTHER): Payer: Self-pay

## 2023-04-22 ENCOUNTER — Emergency Department (HOSPITAL_BASED_OUTPATIENT_CLINIC_OR_DEPARTMENT_OTHER): Payer: BC Managed Care – PPO | Admitting: Radiology

## 2023-04-22 ENCOUNTER — Encounter (HOSPITAL_BASED_OUTPATIENT_CLINIC_OR_DEPARTMENT_OTHER): Payer: Self-pay

## 2023-04-22 ENCOUNTER — Other Ambulatory Visit: Payer: Self-pay

## 2023-04-22 DIAGNOSIS — M546 Pain in thoracic spine: Secondary | ICD-10-CM | POA: Diagnosis not present

## 2023-04-22 DIAGNOSIS — F17290 Nicotine dependence, other tobacco product, uncomplicated: Secondary | ICD-10-CM | POA: Insufficient documentation

## 2023-04-22 DIAGNOSIS — M545 Low back pain, unspecified: Secondary | ICD-10-CM | POA: Insufficient documentation

## 2023-04-22 DIAGNOSIS — Y92410 Unspecified street and highway as the place of occurrence of the external cause: Secondary | ICD-10-CM | POA: Diagnosis not present

## 2023-04-22 DIAGNOSIS — R0789 Other chest pain: Secondary | ICD-10-CM | POA: Diagnosis not present

## 2023-04-22 MED ORDER — IBUPROFEN 600 MG PO TABS
600.0000 mg | ORAL_TABLET | Freq: Four times a day (QID) | ORAL | 0 refills | Status: AC | PRN
  Filled 2023-04-22: qty 30, 8d supply, fill #0

## 2023-04-22 MED ORDER — METHOCARBAMOL 500 MG PO TABS
500.0000 mg | ORAL_TABLET | Freq: Two times a day (BID) | ORAL | 0 refills | Status: AC | PRN
Start: 2023-04-22 — End: ?
  Filled 2023-04-22: qty 20, 10d supply, fill #0

## 2023-04-22 MED ORDER — IBUPROFEN 400 MG PO TABS
600.0000 mg | ORAL_TABLET | Freq: Once | ORAL | Status: AC
Start: 2023-04-22 — End: 2023-04-22
  Administered 2023-04-22: 600 mg via ORAL
  Filled 2023-04-22: qty 1
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
## 2023-04-22 NOTE — ED Notes (Signed)
Reviewed AVS/discharge instruction with patient. Time allotted for and all questions answered. Patient is agreeable for d/c and escorted to pharmacy by staff.  
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
## 2023-04-22 NOTE — Discharge Instructions (Addendum)
As discussed, workup today overall reassuring.  Chest x-ray did not show signs of fracture of rib, collapsed lung or other abnormality.  Recommend continued use of anti-inflammatory medication at home for treatment of your pain.  Also send in muscle ICDs as needed for muscular spasms.  Recommend follow-up with your primary care for reassessment of your symptoms.  Please do not hesitate to return to emergency department for worrisome signs and symptoms we discussed become apparent.

## 2023-10-11 ENCOUNTER — Emergency Department (HOSPITAL_BASED_OUTPATIENT_CLINIC_OR_DEPARTMENT_OTHER)
Admission: EM | Admit: 2023-10-11 | Discharge: 2023-10-11 | Disposition: A | Discharge status: Home / Self Care | Payer: BC Managed Care – PPO | Attending: Emergency Medicine | Admitting: Emergency Medicine

## 2023-10-11 ENCOUNTER — Encounter (HOSPITAL_BASED_OUTPATIENT_CLINIC_OR_DEPARTMENT_OTHER): Payer: Self-pay | Admitting: Emergency Medicine

## 2023-10-11 ENCOUNTER — Other Ambulatory Visit: Payer: Self-pay

## 2023-10-11 DIAGNOSIS — K0889 Other specified disorders of teeth and supporting structures: Secondary | ICD-10-CM | POA: Insufficient documentation

## 2023-10-11 MED ORDER — CLINDAMYCIN HCL 150 MG PO CAPS
300.0000 mg | ORAL_CAPSULE | Freq: Once | ORAL | Status: AC
  Administered 2023-10-11: 300 mg via ORAL
  Filled 2023-10-11: qty 2

## 2023-10-11 MED ORDER — OXYCODONE-ACETAMINOPHEN 5-325 MG PO TABS
1.0000 | ORAL_TABLET | ORAL | Status: DC | PRN
  Administered 2023-10-11: 1 via ORAL
  Filled 2023-10-11: qty 1

## 2023-10-11 MED ORDER — IBUPROFEN 400 MG PO TABS
600.0000 mg | ORAL_TABLET | Freq: Once | ORAL | Status: AC
Start: 2023-10-11 — End: 2023-10-11
  Administered 2023-10-11: 600 mg via ORAL
  Filled 2023-10-11: qty 1

## 2023-10-11 MED ORDER — HYDROCODONE-ACETAMINOPHEN 5-325 MG PO TABS
1.0000 | ORAL_TABLET | Freq: Four times a day (QID) | ORAL | 0 refills | Status: AC | PRN
Start: 2023-10-11 — End: ?

## 2023-10-11 MED ORDER — CLINDAMYCIN HCL 300 MG PO CAPS: 300.0000 mg | ORAL_CAPSULE | Freq: Four times a day (QID) | ORAL | 0 refills | Status: AC

## 2023-10-11 NOTE — ED Provider Notes (Signed)
[place] EMERGENCY DEPARTMENT AT [facility] Provider Note   CSN: [id number] Arrival date & time: 10/11/23  0044     History  Chief Complaint  Patient presents with   Dental Pain    Cassandra Spencer is a 27 y.o. female.  Patient is a 27 year old female presenting with complaints of dental pain.  She describes pain along her right lower molars.  She describes a throbbing pain since yesterday evening.  She has taken ibuprofen with some relief.  No fevers or chills.  No swelling or difficulty breathing.  The history is provided by the patient.       Home Medications Prior to Admission medications   Medication Sig Start Date End Date Taking? Authorizing Provider  hydrOXYzine (ATARAX/VISTARIL) 25 MG tablet Take 1 tablet (25 mg total) by mouth every 6 (six) hours. Prn hives 12/10/16   Linna Hoff, MD  ibuprofen (ADVIL) 600 MG tablet Take 1 tablet (600 mg total) by mouth every 6 (six) hours as needed. 04/22/23   Sherian Maroon A, PA  ipratropium (ATROVENT) 0.06 % nasal spray Place 2 sprays into both nostrils 4 (four) times daily. 11/30/15   Linna Hoff, MD  methocarbamol (ROBAXIN) 500 MG tablet Take 1 tablet (500 mg total) by mouth 2 (two) times daily as needed for muscle spasms. 04/22/23   Sherian Maroon A, PA  ondansetron (ZOFRAN) 4 MG tablet Take 1 tablet (4 mg total) by mouth every 8 (eight) hours as needed for nausea or vomiting. 10/29/21   Milagros Loll, MD  ondansetron (ZOFRAN-ODT) 8 MG disintegrating tablet Take 1 tablet (8 mg total) by mouth every 8 (eight) hours as needed for nausea or vomiting. 01/25/23   Cathren Laine, MD      Allergies    Patient has no known allergies.    Review of Systems   Review of Systems  All other systems reviewed and are negative.   Physical Exam Updated Vital Signs BP 136/85   Pulse 91   Temp 98.6 F (37 C) (Temporal)   Resp 18   Ht 5\' 6"  (1.676 m)   Wt 117.9 kg   SpO2 100%   BMI 41.97 kg/m  Physical Exam Vitals  and nursing note reviewed.  Constitutional:      Appearance: Normal appearance.  HENT:     Mouth/Throat:     Pharynx: No oropharyngeal exudate or posterior oropharyngeal erythema.     Comments: Patient's dentition is basically intact throughout.  The right lower molars appear well with no obvious abscess.  There is mild gingival inflammation. Pulmonary:     Effort: Pulmonary effort is normal.  Musculoskeletal:     Cervical back: Normal range of motion and neck supple. No rigidity or tenderness.  Lymphadenopathy:     Cervical: No cervical adenopathy.  Skin:    General: Skin is warm and dry.  Neurological:     Mental Status: She is alert and oriented to person, place, and time.     ED Results / Procedures / Treatments   Labs (all labs ordered are listed, but only abnormal results are displayed) Labs Reviewed - No data to display  EKG None  Radiology No results found.  Procedures Procedures    Medications Ordered in ED Medications  oxyCODONE-acetaminophen (PERCOCET/ROXICET) 5-325 MG per tablet 1 tablet (1 tablet Oral Given 10/11/23 0154)  clindamycin (CLEOCIN) capsule 300 mg (has no administration in time range)    ED Course/ Medical Decision Making/ A&P  Patient presenting with dental  pain.  This will be treated with clindamycin and pain medication.  She is to follow-up with her dentist.  Final Clinical Impression(s) / ED Diagnoses Final diagnoses:  None    Rx / DC Orders ED Discharge Orders     None         Geoffery Lyons, MD 10/11/23 [phone]

## 2023-10-11 NOTE — ED Triage Notes (Addendum)
Pt POV reporting L side dental pain that began today. Took ibuprofen at midnight w no improvement.

## 2023-10-11 NOTE — ED Notes (Signed)
ED Provider at bedside. 

## 2023-10-11 NOTE — Discharge Instructions (Signed)
Begin taking clindamycin as prescribed.  Take ibuprofen 600 mg every 6 hours as needed for pain.  Begin taking hydrocodone as prescribed as needed for pain not relieved with ibuprofen.  Follow-up with your dentist in the next few days.

## 2024-01-03 ENCOUNTER — Encounter (HOSPITAL_BASED_OUTPATIENT_CLINIC_OR_DEPARTMENT_OTHER): Payer: Self-pay | Admitting: Emergency Medicine

## 2024-01-03 ENCOUNTER — Other Ambulatory Visit: Payer: Self-pay

## 2024-01-03 ENCOUNTER — Emergency Department (HOSPITAL_BASED_OUTPATIENT_CLINIC_OR_DEPARTMENT_OTHER)
Admission: EM | Admit: 2024-01-03 | Discharge: 2024-01-03 | Disposition: A | Discharge status: Home / Self Care | Attending: Emergency Medicine | Admitting: Emergency Medicine

## 2024-01-03 DIAGNOSIS — R1030 Lower abdominal pain, unspecified: Secondary | ICD-10-CM | POA: Insufficient documentation

## 2024-01-03 DIAGNOSIS — R112 Nausea with vomiting, unspecified: Secondary | ICD-10-CM | POA: Diagnosis present

## 2024-01-03 LAB — COMPREHENSIVE METABOLIC PANEL
ALT: 15 U/L (ref 0–44)
AST: 16 U/L (ref 15–41)
Albumin: 4.5 g/dL (ref 3.5–5.0)
Alkaline Phosphatase: 68 U/L (ref 38–126)
Anion gap: 7 (ref 5–15)
BUN: 12 mg/dL (ref 6–20)
CO2: 25 mmol/L (ref 22–32)
Calcium: 10 mg/dL (ref 8.9–10.3)
Chloride: 106 mmol/L (ref 98–111)
Creatinine, Ser: 0.76 mg/dL (ref 0.44–1.00)
GFR, Estimated: >60 mL/min (ref >60)
Glucose, Bld: 97 mg/dL (ref 70–99)
Potassium: 4.4 mmol/L (ref 3.5–5.1)
Sodium: 138 mmol/L (ref 135–145)
Total Bilirubin: 1.3 mg/dL — ABNORMAL HIGH (ref 0.0–1.2)
Total Protein: 7.9 g/dL (ref 6.5–8.1)

## 2024-01-03 LAB — CBC
HCT: 39 % (ref 36.0–46.0)
Hemoglobin: 12.9 g/dL (ref 12.0–15.0)
MCH: 28 pg (ref 26.0–34.0)
MCHC: 33.1 g/dL (ref 30.0–36.0)
MCV: 84.8 fL (ref 80.0–100.0)
Platelets: 215 10*3/uL (ref 150–400)
RBC: 4.6 MIL/uL (ref 3.87–5.11)
RDW: 13.6 % (ref 11.5–15.5)
WBC: 6.9 10*3/uL (ref 4.0–10.5)
nRBC: 0 % (ref 0.0–0.2)

## 2024-01-03 LAB — PREGNANCY, URINE: Preg Test, Ur: NEGATIVE

## 2024-01-03 LAB — URINALYSIS, ROUTINE W REFLEX MICROSCOPIC
Bacteria, UA: NONE SEEN
Bilirubin Urine: NEGATIVE
Glucose, UA: NEGATIVE mg/dL
Ketones, ur: NEGATIVE mg/dL
Leukocytes,Ua: NEGATIVE
Nitrite: NEGATIVE
Protein, ur: 30 mg/dL — AB
Specific Gravity, Urine: 1.032 — ABNORMAL HIGH (ref 1.005–1.030)
pH: 7 (ref 5.0–8.0)

## 2024-01-03 LAB — LIPASE, BLOOD: Lipase: 12 U/L (ref 11–51)

## 2024-01-03 MED ORDER — ONDANSETRON 4 MG PO TBDP: 4.0000 mg | ORAL_TABLET | Freq: Three times a day (TID) | ORAL | 0 refills | Status: DC | PRN

## 2024-01-03 NOTE — Discharge Instructions (Signed)
 Push fluids to avoid dehydration. Use Zofran for nausea as needed.   If you develop worsening symptoms including high fever, severe pain, uncontrolled vomiting or new concern, return to the ED for further evaluation.

## 2024-01-03 NOTE — ED Triage Notes (Signed)
 Pt via pov from home with emesis today x 4; unable to keep water down. Pt endorses "a little cramping" in her abdomen. Pt alert & oriented, nad noted.

## 2024-01-03 NOTE — ED Provider Notes (Signed)
  EMERGENCY DEPARTMENT AT [facility] Provider Note   CSN: [id number] Arrival date & time: 01/03/24  1418     History  Chief Complaint  Patient presents with   Cassandra Spencer    Imo Cumbie is a 28 y.o. female.  Patient to ED with complaint of nausea and vomiting that started today. No fever. She reports mild lower abdominal pain. No abnormal vaginal bleeding or discharge. No urinary symptoms and no diarrhea. She is unsure whether she could be pregnant.   The history is provided by the patient. No language interpreter was used.  Cassandra Spencer      Home Medications Prior to Admission medications   Medication Sig Start Date End Date Taking? Authorizing Provider  ondansetron (ZOFRAN-ODT) 4 MG disintegrating tablet Take 1 tablet (4 mg total) by mouth every 8 (eight) hours as needed for nausea or vomiting. 01/03/24  Yes Erykah Lippert, PA-C  clindamycin (CLEOCIN) 300 MG capsule Take 1 capsule (300 mg total) by mouth 4 (four) times daily. X 7 days 10/11/23   Geoffery Lyons, MD  HYDROcodone-acetaminophen (NORCO) 5-325 MG tablet Take 1-2 tablets by mouth every 6 (six) hours as needed. 10/11/23   Geoffery Lyons, MD  hydrOXYzine (ATARAX/VISTARIL) 25 MG tablet Take 1 tablet (25 mg total) by mouth every 6 (six) hours. Prn hives 12/10/16   Linna Hoff, MD  ibuprofen (ADVIL) 600 MG tablet Take 1 tablet (600 mg total) by mouth every 6 (six) hours as needed. 04/22/23   Sherian Maroon A, PA  ipratropium (ATROVENT) 0.06 % nasal spray Place 2 sprays into both nostrils 4 (four) times daily. 11/30/15   Linna Hoff, MD  methocarbamol (ROBAXIN) 500 MG tablet Take 1 tablet (500 mg total) by mouth 2 (two) times daily as needed for muscle spasms. 04/22/23   Sherian Maroon A, PA  ondansetron (ZOFRAN) 4 MG tablet Take 1 tablet (4 mg total) by mouth every 8 (eight) hours as needed for nausea or vomiting. 10/29/21   Milagros Loll, MD      Allergies    Patient has no known allergies.    Review of  Systems   Review of Systems  Gastrointestinal:  Positive for vomiting.    Physical Exam Updated Vital Signs BP 124/68 (BP Location: Right Arm)   Pulse 76   Temp 99.2 F (37.3 C) (Oral)   Resp 16   Ht 5\' 6"  (1.676 m)   Wt 113.4 kg   LMP 12/10/2023 (Approximate)   SpO2 99%   BMI 40.35 kg/m  Physical Exam Vitals and nursing note reviewed.  Constitutional:      Appearance: Normal appearance.  Cardiovascular:     Rate and Rhythm: Normal rate.  Pulmonary:     Effort: Pulmonary effort is normal.  Abdominal:     General: There is no distension.     Palpations: Abdomen is soft.     Tenderness: There is no abdominal tenderness. There is no guarding or rebound.  Neurological:     Mental Status: She is alert.     ED Results / Procedures / Treatments   Labs (all labs ordered are listed, but only abnormal results are displayed) Labs Reviewed  COMPREHENSIVE METABOLIC PANEL - Abnormal; Notable for the following components:      Result Value   Total Bilirubin 1.3 (*)    All other components within normal limits  URINALYSIS, ROUTINE W REFLEX MICROSCOPIC - Abnormal; Notable for the following components:   Specific Gravity, Urine 1.032 (*)  Hgb urine dipstick MODERATE (*)    Protein, ur 30 (*)    All other components within normal limits  LIPASE, BLOOD  CBC  PREGNANCY, URINE    EKG None  Radiology No results found.  Procedures Procedures    Medications Ordered in ED Medications - No data to display  ED Course/ Medical Decision Making/ A&P Clinical Course as of 01/03/24 1850  Fri Jan 03, 2024  1809 Patient to ED with 4 episodes vomiting today. No symptoms yesterday. No nausea right now. Labs pending.  [SU]  1848 Labs unremarkable. Pregnancy negative. Patient continues to be symptomatic. VSS. She can be discharged home with Rx Zofran prn. Return precautions discussed.  [SU]    Clinical Course User Index [SU] Elpidio Anis, PA-C                                  Medical Decision Making Amount and/or Complexity of Data Reviewed Labs: ordered.           Final Clinical Impression(s) / ED Diagnoses Final diagnoses:  Nausea and vomiting, unspecified vomiting type    Rx / DC Orders ED Discharge Orders          Ordered    ondansetron (ZOFRAN-ODT) 4 MG disintegrating tablet  Every 8 hours PRN        01/03/24 1849              Elpidio Anis, PA-C 01/03/24 1850    Anders Simmonds T, DO 01/03/24 2315

## 2024-04-26 ENCOUNTER — Other Ambulatory Visit: Payer: Self-pay

## 2024-04-26 ENCOUNTER — Emergency Department (HOSPITAL_BASED_OUTPATIENT_CLINIC_OR_DEPARTMENT_OTHER)
Admission: EM | Admit: 2024-04-26 | Discharge: 2024-04-26 | Disposition: A | Discharge status: Home / Self Care | Attending: Emergency Medicine | Admitting: Emergency Medicine

## 2024-04-26 ENCOUNTER — Encounter (HOSPITAL_BASED_OUTPATIENT_CLINIC_OR_DEPARTMENT_OTHER): Payer: Self-pay

## 2024-04-26 DIAGNOSIS — R112 Nausea with vomiting, unspecified: Secondary | ICD-10-CM | POA: Diagnosis present

## 2024-04-26 DIAGNOSIS — K2920 Alcoholic gastritis without bleeding: Secondary | ICD-10-CM | POA: Insufficient documentation

## 2024-04-26 LAB — COMPREHENSIVE METABOLIC PANEL WITH GFR
ALT: 22 U/L (ref 0–44)
AST: 19 U/L (ref 15–41)
Albumin: 4 g/dL (ref 3.5–5.0)
Alkaline Phosphatase: 67 U/L (ref 38–126)
Anion gap: 10 (ref 5–15)
BUN: 13 mg/dL (ref 6–20)
CO2: 22 mmol/L (ref 22–32)
Calcium: 9.1 mg/dL (ref 8.9–10.3)
Chloride: 107 mmol/L (ref 98–111)
Creatinine, Ser: 0.71 mg/dL (ref 0.44–1.00)
GFR, Estimated: >60 mL/min (ref >60)
Glucose, Bld: 97 mg/dL (ref 70–99)
Potassium: 4.4 mmol/L (ref 3.5–5.1)
Sodium: 139 mmol/L (ref 135–145)
Total Bilirubin: 0.7 mg/dL (ref 0.0–1.2)
Total Protein: 7.2 g/dL (ref 6.5–8.1)

## 2024-04-26 LAB — CBC WITH DIFFERENTIAL/PLATELET
Abs Immature Granulocytes: 0.02 K/uL (ref 0.00–0.07)
Basophils Absolute: 0.1 K/uL (ref 0.0–0.1)
Basophils Relative: 1 %
Eosinophils Absolute: 0.2 K/uL (ref 0.0–0.5)
Eosinophils Relative: 4 %
HCT: 37.3 % (ref 36.0–46.0)
Hemoglobin: 12.5 g/dL (ref 12.0–15.0)
Immature Granulocytes: 0 %
Lymphocytes Relative: 16 %
Lymphs Abs: 1.1 K/uL (ref 0.7–4.0)
MCH: 28.2 pg (ref 26.0–34.0)
MCHC: 33.5 g/dL (ref 30.0–36.0)
MCV: 84 fL (ref 80.0–100.0)
Monocytes Absolute: 0.4 K/uL (ref 0.1–1.0)
Monocytes Relative: 7 %
Neutro Abs: 4.8 K/uL (ref 1.7–7.7)
Neutrophils Relative %: 72 %
Platelets: 232 K/uL (ref 150–400)
RBC: 4.44 MIL/uL (ref 3.87–5.11)
RDW: 13.4 % (ref 11.5–15.5)
WBC: 6.6 K/uL (ref 4.0–10.5)
nRBC: 0 % (ref 0.0–0.2)

## 2024-04-26 LAB — PREGNANCY, URINE: Preg Test, Ur: NEGATIVE

## 2024-04-26 LAB — LIPASE, BLOOD: Lipase: 17 U/L (ref 11–51)

## 2024-04-26 LAB — URINALYSIS, ROUTINE W REFLEX MICROSCOPIC
Bacteria, UA: NONE SEEN
Bilirubin Urine: NEGATIVE
Glucose, UA: NEGATIVE mg/dL
Hgb urine dipstick: NEGATIVE
Ketones, ur: NEGATIVE mg/dL
Nitrite: NEGATIVE
Protein, ur: NEGATIVE mg/dL
Specific Gravity, Urine: 1.027 (ref 1.005–1.030)
pH: 7 (ref 5.0–8.0)

## 2024-04-26 MED ORDER — ONDANSETRON 4 MG PO TBDP: 4.0000 mg | ORAL_TABLET | Freq: Three times a day (TID) | ORAL | 0 refills | Status: AC | PRN

## 2024-04-26 MED ORDER — PANTOPRAZOLE SODIUM 40 MG IV SOLR
40.0000 mg | Freq: Once | INTRAVENOUS | Status: AC
  Administered 2024-04-26: 40 mg via INTRAVENOUS
  Filled 2024-04-26: qty 10

## 2024-04-26 MED ORDER — SODIUM CHLORIDE 0.9 % IV BOLUS
1000.0000 mL | Freq: Once | INTRAVENOUS | Status: AC
  Administered 2024-04-26: 1000 mL via INTRAVENOUS

## 2024-04-26 MED ORDER — ONDANSETRON HCL 4 MG/2ML IJ SOLN
4.0000 mg | Freq: Once | INTRAMUSCULAR | Status: AC
Start: 2024-04-26 — End: 2024-04-26
  Administered 2024-04-26: 4 mg via INTRAVENOUS
  Filled 2024-04-26: qty 2

## 2024-04-26 NOTE — ED Notes (Signed)
 Pt tolerating PO challenge appropriately at this time.

## 2024-04-26 NOTE — Discharge Instructions (Signed)
 Please read and follow all provided instructions.  Your diagnoses today include:  1. Acute alcoholic gastritis without hemorrhage   2. Nausea and vomiting, unspecified vomiting type     Tests performed today include: Complete blood cell count: No concerns Complete metabolic panel: No concerns, normal liver and kidney function testing Lipase (pancreas function test): No signs of pancreatitis Urinalysis (urine test): A few white blood cells, but no definite infection Pregnancy test (urine or blood, in women only): Was negative Vital signs. See below for your results today.   Medications prescribed:  Zofran  (ondansetron ) - for nausea and vomiting  Take any prescribed medications only as directed.  Home care instructions:  Follow any educational materials contained in this packet.  Follow-up instructions: Please follow-up with your primary care provider in the next 2 days for further evaluation of your symptoms if not improving  Return instructions:  SEEK IMMEDIATE MEDICAL ATTENTION IF: The pain does not go away or becomes severe  A temperature above 101F develops  Repeated vomiting occurs (multiple episodes)  The pain becomes localized to portions of the abdomen. The right side could possibly be appendicitis. In an adult, the left lower portion of the abdomen could be colitis or diverticulitis.  Blood is being passed in stools or vomit (bright red or black tarry stools)  You develop chest pain, difficulty breathing, dizziness or fainting, or become confused, poorly responsive, or inconsolable (young children) If you have any other emergent concerns regarding your health  Additional Information: Abdominal (belly) pain can be caused by many things. Your caregiver performed an examination and possibly ordered blood/urine tests and imaging (CT scan, x-rays, ultrasound). Many cases can be observed and treated at home after initial evaluation in the emergency department. Even though you  are being discharged home, abdominal pain can be unpredictable. Therefore, you need a repeated exam if your pain does not resolve, returns, or worsens. Most patients with abdominal pain don't have to be admitted to the hospital or have surgery, but serious problems like appendicitis and gallbladder attacks can start out as nonspecific pain. Many abdominal conditions cannot be diagnosed in one visit, so follow-up evaluations are very important.  Your vital signs today were: BP 112/78   Pulse 85   Temp 98.7 F (37.1 C) (Oral)   Resp 18   Ht 5' 6 (1.676 m)   Wt 117.9 kg   SpO2 100%   BMI 41.97 kg/m  If your blood pressure (bp) was elevated above 135/85 this visit, please have this repeated by your doctor within one month. --------------

## 2024-04-26 NOTE — ED Provider Notes (Signed)
 [place] EMERGENCY DEPARTMENT AT [facility] Provider Note   CSN: [id number] Arrival date & time: 04/26/24  1326     Patient presents with: Vomiting   Cassandra Spencer is a 27 y.o. female.   Patient with no past surgical history presents to the emergency department today for evaluation of vomiting.  Patient reports drinking alcohol yesterday.  This morning she woke up with vomiting.  She has had multiple episodes of vomiting starting after 10 AM.  She states that she cannot even keep down water.  No significant abdominal pain or back pain.  No fevers.  No urinary symptoms.  In the past she has had similar symptoms, improved with IV fluids.       Prior to Admission medications   Medication Sig Start Date End Date Taking? Authorizing Provider  clindamycin  (CLEOCIN ) 300 MG capsule Take 1 capsule (300 mg total) by mouth 4 (four) times daily. X 7 days 10/11/23   Geroldine Berg, MD  HYDROcodone -acetaminophen  (NORCO) 5-325 MG tablet Take 1-2 tablets by mouth every 6 (six) hours as needed. 10/11/23   Geroldine Berg, MD  hydrOXYzine  (ATARAX /VISTARIL ) 25 MG tablet Take 1 tablet (25 mg total) by mouth every 6 (six) hours. Prn hives 12/10/16   Vincente Lynwood BIRCH, MD  ibuprofen  (ADVIL ) 600 MG tablet Take 1 tablet (600 mg total) by mouth every 6 (six) hours as needed. 04/22/23   Silver Fell A, PA  ipratropium (ATROVENT ) 0.06 % nasal spray Place 2 sprays into both nostrils 4 (four) times daily. 11/30/15   Vincente Lynwood BIRCH, MD  methocarbamol  (ROBAXIN ) 500 MG tablet Take 1 tablet (500 mg total) by mouth 2 (two) times daily as needed for muscle spasms. 04/22/23   Silver Fell LABOR, PA  ondansetron  (ZOFRAN ) 4 MG tablet Take 1 tablet (4 mg total) by mouth every 8 (eight) hours as needed for nausea or vomiting. 10/29/21   Schuyler Charlie RAMAN, MD  ondansetron  (ZOFRAN -ODT) 4 MG disintegrating tablet Take 1 tablet (4 mg total) by mouth every 8 (eight) hours as needed for nausea or vomiting. 01/03/24   Odell Balls, PA-C    Allergies: Patient has no known allergies.    Review of Systems  Updated Vital Signs BP 134/79   Pulse 88   Temp 98.7 F (37.1 C) (Oral)   Resp 20   Ht 5' 6 (1.676 m)   Wt 117.9 kg   SpO2 99%   BMI 41.97 kg/m   Physical Exam Vitals and nursing note reviewed.  Constitutional:      General: She is not in acute distress.    Appearance: She is well-developed.  HENT:     Head: Normocephalic and atraumatic.     Right Ear: External ear normal.     Left Ear: External ear normal.     Nose: Nose normal.  Eyes:     Conjunctiva/sclera: Conjunctivae normal.  Cardiovascular:     Rate and Rhythm: Normal rate and regular rhythm.     Heart sounds: No murmur heard. Pulmonary:     Effort: No respiratory distress.     Breath sounds: No wheezing, rhonchi or rales.  Abdominal:     Palpations: Abdomen is soft.     Tenderness: There is no abdominal tenderness. There is no guarding or rebound.  Musculoskeletal:     Cervical back: Normal range of motion and neck supple.     Right lower leg: No edema.     Left lower leg: No edema.  Skin:  General: Skin is warm and dry.     Findings: No rash.  Neurological:     General: No focal deficit present.     Mental Status: She is alert. Mental status is at baseline.     Motor: No weakness.     Comments: Patient up to restroom without difficulties.  Psychiatric:        Mood and Affect: Mood normal.     (all labs ordered are listed, but only abnormal results are displayed) Labs Reviewed  URINALYSIS, ROUTINE W REFLEX MICROSCOPIC - Abnormal; Notable for the following components:      Result Value   Leukocytes,Ua LARGE (*)    All other components within normal limits  CBC WITH DIFFERENTIAL/PLATELET  PREGNANCY, URINE  COMPREHENSIVE METABOLIC PANEL WITH GFR  LIPASE, BLOOD    EKG: None  Radiology: No results found.   Procedures   Medications Ordered in the ED  sodium chloride  0.9 % bolus 1,000 mL (has no  administration in time range)  ondansetron  (ZOFRAN ) injection 4 mg (has no administration in time range)  pantoprazole  (PROTONIX ) injection 40 mg (has no administration in time range)   ED Course  Patient seen and examined. History obtained directly from patient.   Labs/EKG: Ordered CBC, CMP, lipase, UA, pregnancy.  Imaging: None ordered  Medications/Fluids: Ordered: IV fluid bolus, IV Zofran /Protonix .   Most recent vital signs reviewed and are as follows: BP 134/79   Pulse 88   Temp 98.7 F (37.1 C) (Oral)   Resp 20   Ht 5' 6 (1.676 m)   Wt 117.9 kg   SpO2 99%   BMI 41.97 kg/m   Initial impression: Patient with nausea and vomiting, likely related to gastritis in setting of recent alcohol use.  Will ensure no pancreatitis or signs of gallbladder disease.  3:37 PM Reassessment performed. Patient appears stable. Tolerating water. Comfortable with dc.   Labs personally reviewed and interpreted including: CBC negative, CMP/lipase negative, UA with some WBC but no urinary symptoms; preg neg.   Reviewed pertinent lab work and imaging with patient at bedside. Questions answered.   Most current vital signs reviewed and are as follows: BP 112/78   Pulse 85   Temp 98.7 F (37.1 C) (Oral)   Resp 18   Ht 5' 6 (1.676 m)   Wt 117.9 kg   SpO2 100%   BMI 41.97 kg/m   Plan: Discharge to home.   Prescriptions written for: zofran   Other home care instructions discussed: Clear liquid to bland diet over the next 12 to 24 hours, slow advancement.  ED return instructions discussed: The patient was urged to return to the Emergency Department immediately with worsening of current symptoms, worsening abdominal pain, persistent vomiting, blood noted in stools, fever, or any other concerns. The patient verbalized understanding.   Follow-up instructions discussed: Patient encouraged to follow-up with their PCP in 2 days if not improved.                                    Medical  Decision Making Amount and/or Complexity of Data Reviewed Labs: ordered.  Risk Prescription drug management.   For this patient's complaint of abdominal pain, the following conditions were considered on the differential diagnosis: gastritis/PUD, enteritis/duodenitis, appendicitis, cholelithiasis/cholecystitis, cholangitis, pancreatitis, ruptured viscus, colitis, diverticulitis, small/large bowel obstruction, proctitis, cystitis, pyelonephritis, ureteral colic, aortic dissection, aortic aneurysm. In women, ectopic pregnancy, pelvic inflammatory disease, ovarian cysts, and  tubo-ovarian abscess were also considered. Atypical chest etiologies were also considered including ACS, PE, and pneumonia.  Labs are reassuring.  Patient clinically improved with IV fluids and antiemetics.  History and symptomatology consistent with alcoholic gastritis.  The patient's vital signs, pertinent lab work and imaging were reviewed and interpreted as discussed in the ED course. Hospitalization was considered for further testing, treatments, or serial exams/observation. However as patient is well-appearing, has a stable exam, and reassuring studies today, I do not feel that they warrant admission at this time. This plan was discussed with the patient who verbalizes agreement and comfort with this plan and seems reliable and able to return to the Emergency Department with worsening or changing symptoms.       Final diagnoses:  Acute alcoholic gastritis without hemorrhage  Nausea and vomiting, unspecified vomiting type    ED Discharge Orders          Ordered    ondansetron  (ZOFRAN -ODT) 4 MG disintegrating tablet  Every 8 hours PRN        04/26/24 1536               Tomy Khim, PA-C 04/26/24 1545    Tonia Chew, MD 04/27/24 1524

## 2024-04-26 NOTE — ED Notes (Signed)
Pt given water to PO challenge at this time.

## 2024-04-26 NOTE — ED Triage Notes (Signed)
 Arrives POV with complaints of vomiting that started this morning. Patient reports that she consumed several ETOH shots overnight (~4). No abdominal pain.

## 2024-04-26 NOTE — ED Notes (Signed)
 Light green tops re-drawn and sent to lab

## 2024-04-26 NOTE — ED Notes (Signed)
 Pt d/c instructions, medications, and follow-up care reviewed with pt. Pt verbalized understanding and had no further questions at time of d/c. Pt CA&Ox4, ambulatory, and in NAD at time of d/c
